# Patient Record
Sex: Female | Born: 1960 | Race: White | Hispanic: No | Marital: Single
Health system: Southern US, Community
[De-identification: ages and names within clinical notes are randomized; demographics above are authoritative.]

## PROBLEM LIST (undated history)

## (undated) HISTORY — PX: BREAST BIOPSY: SHX20

---

## 1997-09-14 ENCOUNTER — Other Ambulatory Visit: Admission: RE | Admit: 1997-09-14 | Discharge: 1997-09-14 | Payer: Self-pay | Admitting: Obstetrics and Gynecology

## 1998-11-10 ENCOUNTER — Other Ambulatory Visit: Admission: RE | Admit: 1998-11-10 | Discharge: 1998-11-10 | Payer: Self-pay | Admitting: Obstetrics and Gynecology

## 1999-11-14 ENCOUNTER — Other Ambulatory Visit: Admission: RE | Admit: 1999-11-14 | Discharge: 1999-11-14 | Payer: Self-pay | Admitting: Obstetrics and Gynecology

## 2000-11-05 ENCOUNTER — Ambulatory Visit (HOSPITAL_COMMUNITY): Admission: RE | Admit: 2000-11-05 | Discharge: 2000-11-05 | Payer: Self-pay | Admitting: Obstetrics and Gynecology

## 2000-11-05 ENCOUNTER — Encounter: Payer: Self-pay | Admitting: Obstetrics and Gynecology

## 2000-11-14 ENCOUNTER — Other Ambulatory Visit: Admission: RE | Admit: 2000-11-14 | Discharge: 2000-11-14 | Payer: Self-pay | Admitting: Obstetrics and Gynecology

## 2004-11-16 ENCOUNTER — Other Ambulatory Visit: Admission: RE | Admit: 2004-11-16 | Discharge: 2004-11-16 | Payer: Self-pay | Admitting: *Deleted

## 2005-04-26 ENCOUNTER — Ambulatory Visit (HOSPITAL_COMMUNITY): Admission: RE | Admit: 2005-04-26 | Discharge: 2005-04-26 | Payer: Self-pay | Admitting: *Deleted

## 2006-05-29 ENCOUNTER — Ambulatory Visit (HOSPITAL_COMMUNITY): Admission: RE | Admit: 2006-05-29 | Discharge: 2006-05-29 | Payer: Self-pay | Admitting: *Deleted

## 2007-05-30 ENCOUNTER — Ambulatory Visit (HOSPITAL_COMMUNITY): Admission: RE | Admit: 2007-05-30 | Discharge: 2007-05-30 | Payer: Self-pay | Admitting: *Deleted

## 2007-08-18 ENCOUNTER — Other Ambulatory Visit: Admission: RE | Admit: 2007-08-18 | Discharge: 2007-08-18 | Payer: Self-pay | Admitting: Gynecology

## 2008-10-27 ENCOUNTER — Ambulatory Visit (HOSPITAL_COMMUNITY): Admission: RE | Admit: 2008-10-27 | Discharge: 2008-10-27 | Payer: Self-pay | Admitting: Gynecology

## 2009-11-08 ENCOUNTER — Encounter: Admission: RE | Admit: 2009-11-08 | Discharge: 2009-11-08 | Payer: Self-pay | Admitting: Gynecology

## 2010-05-10 ENCOUNTER — Other Ambulatory Visit: Payer: Self-pay | Admitting: Gynecology

## 2010-05-10 DIAGNOSIS — Z09 Encounter for follow-up examination after completed treatment for conditions other than malignant neoplasm: Secondary | ICD-10-CM

## 2010-05-18 ENCOUNTER — Ambulatory Visit
Admission: RE | Admit: 2010-05-18 | Discharge: 2010-05-18 | Disposition: A | Discharge status: Home / Self Care | Payer: 59 | Source: Ambulatory Visit | Attending: Gynecology | Admitting: Gynecology

## 2010-05-18 DIAGNOSIS — Z09 Encounter for follow-up examination after completed treatment for conditions other than malignant neoplasm: Secondary | ICD-10-CM

## 2010-11-23 ENCOUNTER — Other Ambulatory Visit: Payer: Self-pay | Admitting: Gynecology

## 2010-11-23 DIAGNOSIS — N6489 Other specified disorders of breast: Secondary | ICD-10-CM

## 2010-11-30 ENCOUNTER — Ambulatory Visit
Admission: RE | Admit: 2010-11-30 | Discharge: 2010-11-30 | Disposition: A | Discharge status: Home / Self Care | Payer: 59 | Source: Ambulatory Visit | Attending: Gynecology | Admitting: Gynecology

## 2010-11-30 DIAGNOSIS — N6489 Other specified disorders of breast: Secondary | ICD-10-CM

## 2011-12-18 ENCOUNTER — Other Ambulatory Visit: Payer: Self-pay | Admitting: Gynecology

## 2011-12-18 DIAGNOSIS — R928 Other abnormal and inconclusive findings on diagnostic imaging of breast: Secondary | ICD-10-CM

## 2011-12-31 ENCOUNTER — Ambulatory Visit
Admission: RE | Admit: 2011-12-31 | Discharge: 2011-12-31 | Disposition: A | Discharge status: Home / Self Care | Payer: 59 | Source: Ambulatory Visit | Attending: Gynecology | Admitting: Gynecology

## 2011-12-31 ENCOUNTER — Other Ambulatory Visit: Payer: Self-pay | Admitting: Gynecology

## 2011-12-31 DIAGNOSIS — R928 Other abnormal and inconclusive findings on diagnostic imaging of breast: Secondary | ICD-10-CM

## 2012-10-22 ENCOUNTER — Other Ambulatory Visit: Payer: Self-pay | Admitting: Gynecology

## 2012-10-22 DIAGNOSIS — R921 Mammographic calcification found on diagnostic imaging of breast: Secondary | ICD-10-CM

## 2012-11-03 ENCOUNTER — Ambulatory Visit
Admission: RE | Admit: 2012-11-03 | Discharge: 2012-11-03 | Disposition: A | Discharge status: Home / Self Care | Payer: 59 | Source: Ambulatory Visit | Attending: Gynecology | Admitting: Gynecology

## 2012-11-03 ENCOUNTER — Other Ambulatory Visit: Payer: Self-pay | Admitting: Gynecology

## 2012-11-03 DIAGNOSIS — R921 Mammographic calcification found on diagnostic imaging of breast: Secondary | ICD-10-CM

## 2012-11-07 ENCOUNTER — Ambulatory Visit
Admission: RE | Admit: 2012-11-07 | Discharge: 2012-11-07 | Disposition: A | Discharge status: Home / Self Care | Payer: 59 | Source: Ambulatory Visit | Attending: Gynecology | Admitting: Gynecology

## 2012-11-07 ENCOUNTER — Other Ambulatory Visit: Payer: Self-pay | Admitting: Diagnostic Radiology

## 2012-11-07 DIAGNOSIS — R921 Mammographic calcification found on diagnostic imaging of breast: Secondary | ICD-10-CM

## 2013-07-01 ENCOUNTER — Other Ambulatory Visit: Payer: Self-pay | Admitting: Gynecology

## 2013-07-01 DIAGNOSIS — R921 Mammographic calcification found on diagnostic imaging of breast: Secondary | ICD-10-CM

## 2013-07-14 ENCOUNTER — Ambulatory Visit
Admission: RE | Admit: 2013-07-14 | Discharge: 2013-07-14 | Disposition: A | Discharge status: Home / Self Care | Payer: 59 | Source: Ambulatory Visit | Attending: Gynecology | Admitting: Gynecology

## 2013-07-14 DIAGNOSIS — R921 Mammographic calcification found on diagnostic imaging of breast: Secondary | ICD-10-CM

## 2013-11-12 ENCOUNTER — Other Ambulatory Visit: Payer: Self-pay | Admitting: Gynecology

## 2013-11-12 DIAGNOSIS — R921 Mammographic calcification found on diagnostic imaging of breast: Secondary | ICD-10-CM

## 2013-11-24 ENCOUNTER — Encounter (INDEPENDENT_AMBULATORY_CARE_PROVIDER_SITE_OTHER): Payer: Self-pay

## 2013-11-24 ENCOUNTER — Ambulatory Visit
Admission: RE | Admit: 2013-11-24 | Discharge: 2013-11-24 | Disposition: A | Discharge status: Home / Self Care | Payer: 59 | Source: Ambulatory Visit | Attending: Gynecology | Admitting: Gynecology

## 2013-11-24 DIAGNOSIS — R921 Mammographic calcification found on diagnostic imaging of breast: Secondary | ICD-10-CM

## 2014-05-28 ENCOUNTER — Other Ambulatory Visit: Payer: Self-pay | Admitting: Family Medicine

## 2014-05-28 ENCOUNTER — Ambulatory Visit
Admission: RE | Admit: 2014-05-28 | Discharge: 2014-05-28 | Disposition: A | Discharge status: Home / Self Care | Payer: 59 | Source: Ambulatory Visit | Attending: Family Medicine | Admitting: Family Medicine

## 2014-05-28 DIAGNOSIS — M25541 Pain in joints of right hand: Secondary | ICD-10-CM

## 2014-08-25 IMAGING — MG MM BREAST STEREO BIOPSY LEFT
3 series · 3 of 3 positions shown · non-contrast
Comparison: Previous exams.

ADDENDUM:
Pathology demonstrates benign breast tissue with very focal
fibrocystic changes and associated calcifications. This is
concordant. The patient was notified of results on 11/10/2012. Other
than some soreness, the patient has no complaints regarding her
biopsy site. A 6 month followup left diagnostic mammogram is
recommended.
CLINICAL DATA: 52-year-old female with suspicious calcifications in
the left breast at 12 o'clock.

EXAM:
STEREOTACTIC CORE NEEDLE BIOPSY

[L CC]
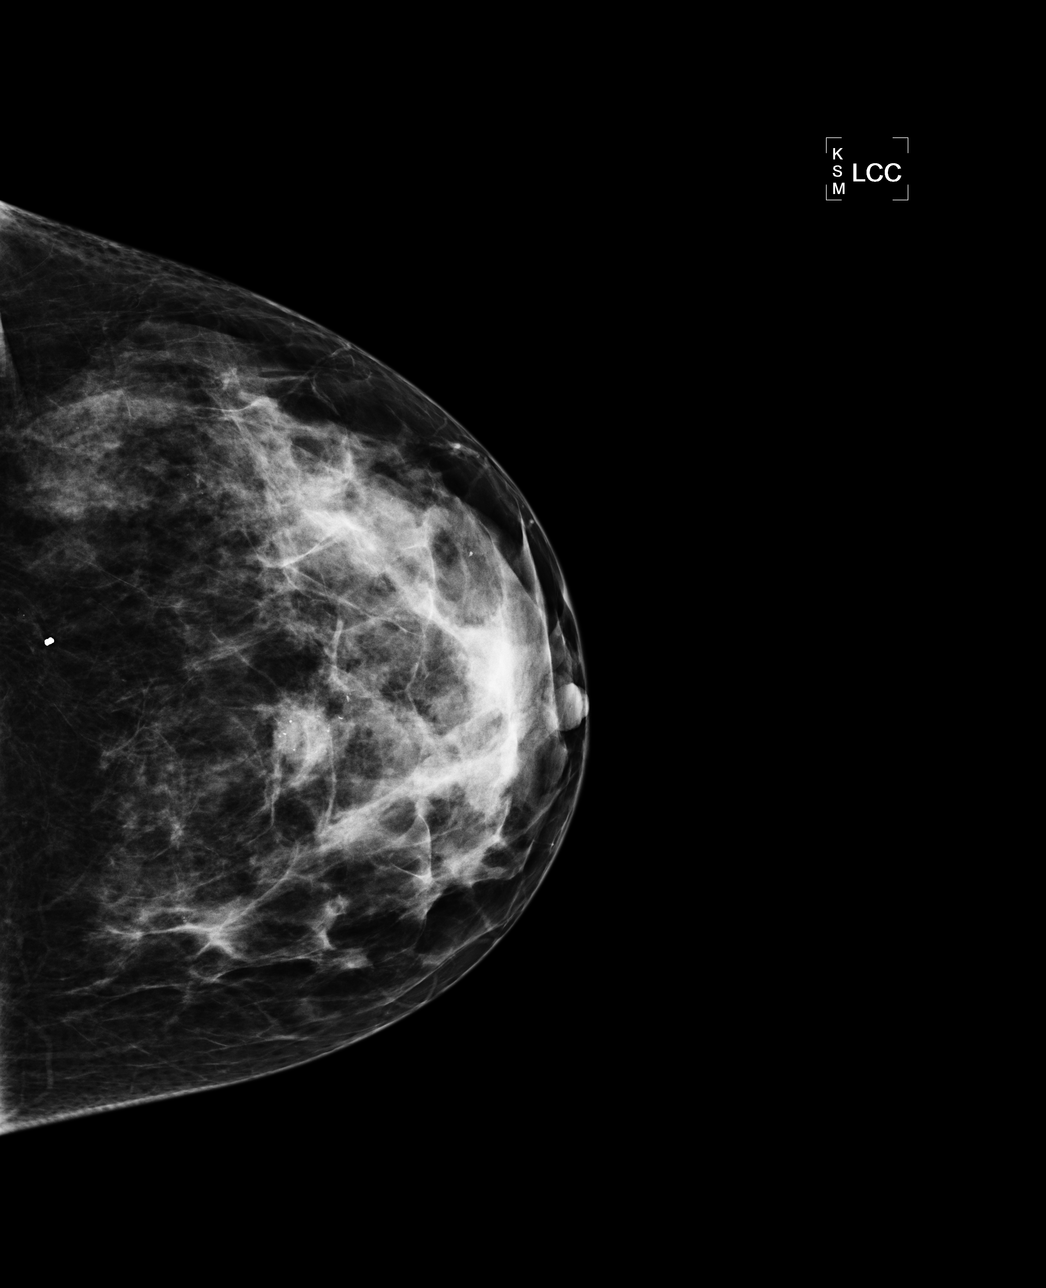

[L ML]
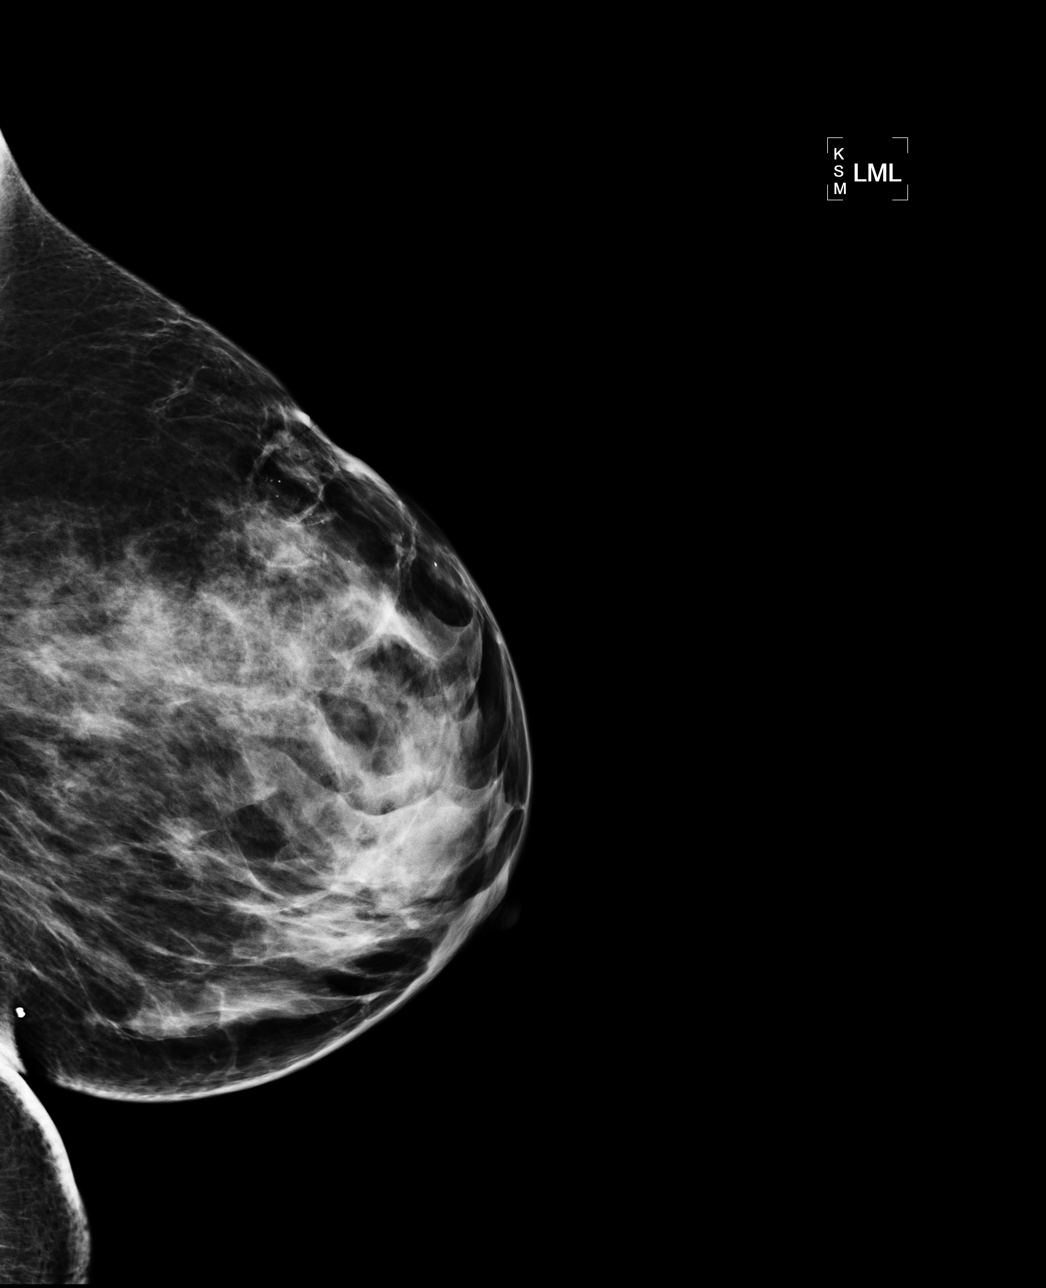

[L SPECIMEN]
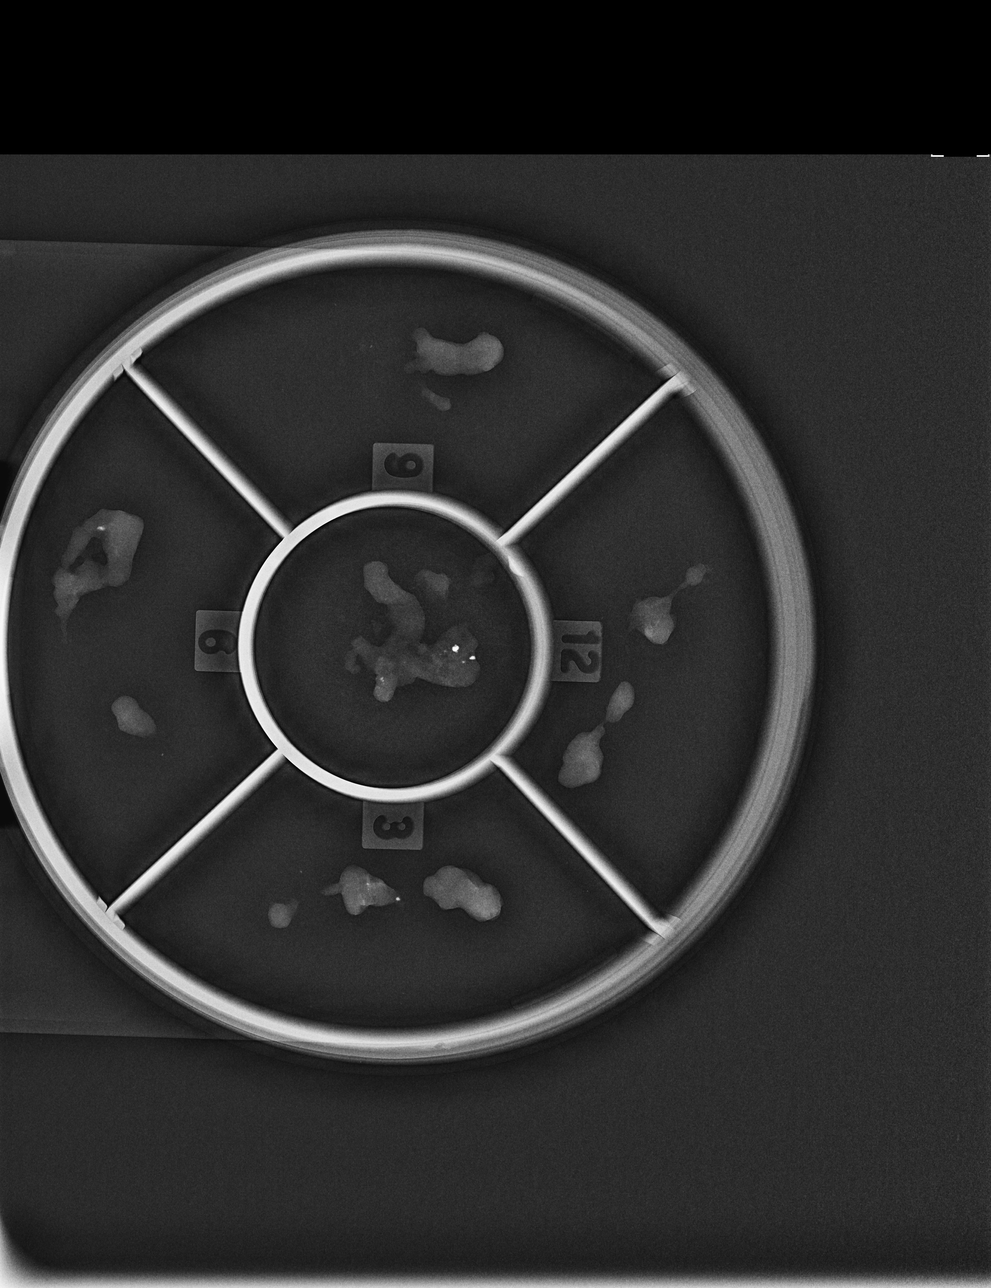

[3 of 3 positions shown; findings below may reference images not displayed]

FINDINGS: I met with the patient and we discussed the procedure of
stereotactic-guided biopsy, including benefits and alternatives. We
discussed the high likelihood of a successful procedure. We
discussed the risks of the procedure, including infection, bleeding,
tissue injury, clip migration, and inadequate sampling. Informed,
written consent was given.

Using sterile technique and 2% Lidocaine as local anesthetic, under
stereotactic guidance, a 9 gauge vacuum device was used to perform
core needle biopsy of the suspicious calcifications in the left
breast at 12 o'clock anterior to mid depth using a superior
approach. Specimen radiograph was performed, showing calcifications
in at least 3 of the specimens. Specimens with calcifications are
identified for pathology.

At the conclusion of the procedure, a dumb Sympa tissue marker clip
was deployed into the biopsy cavity immediately post biopsy, however
on the followup mammogram the clip was no longer present. The
patient could have reported excessive bleeding after the procedure,
in the clip likely blood Sympa along the biopsy tract. The biopsied
calcifications in the left breast at 12 o'clock mid anterior depth
are circled on the post procedure mammogram for future reference.
IMPRESSION: 1. Stereotactic-guided biopsy of suspicious calcifications in the
left breast at 12 o'clock anterior to mid depth.

2. The biopsy marking clip likely bled out along the biopsy tract as
the clip was not present on the post biopsy mammogram. The biopsy
calcifications are circled for future reference.

## 2014-11-22 ENCOUNTER — Other Ambulatory Visit: Payer: Self-pay

## 2014-11-22 DIAGNOSIS — Z1231 Encounter for screening mammogram for malignant neoplasm of breast: Secondary | ICD-10-CM

## 2014-12-22 ENCOUNTER — Ambulatory Visit
Admission: RE | Admit: 2014-12-22 | Discharge: 2014-12-22 | Disposition: A | Discharge status: Home / Self Care | Payer: 59 | Source: Ambulatory Visit

## 2014-12-22 DIAGNOSIS — Z1231 Encounter for screening mammogram for malignant neoplasm of breast: Secondary | ICD-10-CM

## 2015-11-07 ENCOUNTER — Other Ambulatory Visit: Payer: Self-pay | Admitting: Obstetrics and Gynecology

## 2015-11-07 DIAGNOSIS — Z1231 Encounter for screening mammogram for malignant neoplasm of breast: Secondary | ICD-10-CM

## 2015-12-23 ENCOUNTER — Ambulatory Visit
Admission: RE | Admit: 2015-12-23 | Discharge: 2015-12-23 | Disposition: A | Discharge status: Home / Self Care | Payer: 59 | Source: Ambulatory Visit | Attending: Obstetrics and Gynecology | Admitting: Obstetrics and Gynecology

## 2015-12-23 DIAGNOSIS — Z1231 Encounter for screening mammogram for malignant neoplasm of breast: Secondary | ICD-10-CM

## 2016-02-20 DIAGNOSIS — Z01419 Encounter for gynecological examination (general) (routine) without abnormal findings: Secondary | ICD-10-CM | POA: Diagnosis not present

## 2016-06-01 ENCOUNTER — Other Ambulatory Visit: Payer: Self-pay | Admitting: Family Medicine

## 2016-06-01 ENCOUNTER — Ambulatory Visit
Admission: RE | Admit: 2016-06-01 | Discharge: 2016-06-01 | Disposition: A | Discharge status: Home / Self Care | Payer: 59 | Source: Ambulatory Visit | Attending: Family Medicine | Admitting: Family Medicine

## 2016-06-01 DIAGNOSIS — G4762 Sleep related leg cramps: Secondary | ICD-10-CM | POA: Diagnosis not present

## 2016-06-01 DIAGNOSIS — M25561 Pain in right knee: Secondary | ICD-10-CM | POA: Diagnosis not present

## 2016-06-01 DIAGNOSIS — B079 Viral wart, unspecified: Secondary | ICD-10-CM | POA: Diagnosis not present

## 2016-06-19 DIAGNOSIS — B079 Viral wart, unspecified: Secondary | ICD-10-CM | POA: Diagnosis not present

## 2016-11-21 ENCOUNTER — Other Ambulatory Visit: Payer: Self-pay | Admitting: Obstetrics and Gynecology

## 2016-11-21 DIAGNOSIS — Z1231 Encounter for screening mammogram for malignant neoplasm of breast: Secondary | ICD-10-CM

## 2016-12-24 ENCOUNTER — Ambulatory Visit
Admission: RE | Admit: 2016-12-24 | Discharge: 2016-12-24 | Disposition: A | Discharge status: Home / Self Care | Payer: 59 | Source: Ambulatory Visit | Attending: Obstetrics and Gynecology | Admitting: Obstetrics and Gynecology

## 2016-12-24 ENCOUNTER — Encounter: Payer: Self-pay | Admitting: Radiology

## 2016-12-24 DIAGNOSIS — Z1231 Encounter for screening mammogram for malignant neoplasm of breast: Secondary | ICD-10-CM

## 2016-12-25 ENCOUNTER — Other Ambulatory Visit: Payer: Self-pay | Admitting: Obstetrics and Gynecology

## 2016-12-25 DIAGNOSIS — R928 Other abnormal and inconclusive findings on diagnostic imaging of breast: Secondary | ICD-10-CM

## 2016-12-27 ENCOUNTER — Ambulatory Visit
Admission: RE | Admit: 2016-12-27 | Discharge: 2016-12-27 | Disposition: A | Discharge status: Home / Self Care | Payer: 59 | Source: Ambulatory Visit | Attending: Obstetrics and Gynecology | Admitting: Obstetrics and Gynecology

## 2016-12-27 DIAGNOSIS — R921 Mammographic calcification found on diagnostic imaging of breast: Secondary | ICD-10-CM | POA: Diagnosis not present

## 2016-12-27 DIAGNOSIS — R928 Other abnormal and inconclusive findings on diagnostic imaging of breast: Secondary | ICD-10-CM

## 2017-02-28 DIAGNOSIS — Z6830 Body mass index (BMI) 30.0-30.9, adult: Secondary | ICD-10-CM | POA: Diagnosis not present

## 2017-02-28 DIAGNOSIS — Z01419 Encounter for gynecological examination (general) (routine) without abnormal findings: Secondary | ICD-10-CM | POA: Diagnosis not present

## 2017-11-12 ENCOUNTER — Other Ambulatory Visit: Payer: Self-pay | Admitting: Obstetrics and Gynecology

## 2017-11-12 DIAGNOSIS — Z1231 Encounter for screening mammogram for malignant neoplasm of breast: Secondary | ICD-10-CM

## 2017-12-25 ENCOUNTER — Ambulatory Visit
Admission: RE | Admit: 2017-12-25 | Discharge: 2017-12-25 | Disposition: A | Discharge status: Home / Self Care | Payer: 59 | Source: Ambulatory Visit | Attending: Obstetrics and Gynecology | Admitting: Obstetrics and Gynecology

## 2017-12-25 DIAGNOSIS — Z1231 Encounter for screening mammogram for malignant neoplasm of breast: Secondary | ICD-10-CM

## 2018-06-05 DIAGNOSIS — M19042 Primary osteoarthritis, left hand: Secondary | ICD-10-CM | POA: Diagnosis not present

## 2018-06-05 DIAGNOSIS — M19041 Primary osteoarthritis, right hand: Secondary | ICD-10-CM | POA: Diagnosis not present

## 2018-06-05 DIAGNOSIS — L03012 Cellulitis of left finger: Secondary | ICD-10-CM | POA: Diagnosis not present

## 2018-06-08 DIAGNOSIS — L02512 Cutaneous abscess of left hand: Secondary | ICD-10-CM | POA: Diagnosis not present

## 2019-01-27 ENCOUNTER — Other Ambulatory Visit: Payer: Self-pay | Admitting: Obstetrics and Gynecology

## 2019-01-27 DIAGNOSIS — Z1231 Encounter for screening mammogram for malignant neoplasm of breast: Secondary | ICD-10-CM

## 2019-01-28 ENCOUNTER — Ambulatory Visit
Admission: RE | Admit: 2019-01-28 | Discharge: 2019-01-28 | Disposition: A | Discharge status: Home / Self Care | Payer: BC Managed Care – PPO | Source: Ambulatory Visit | Attending: Obstetrics and Gynecology | Admitting: Obstetrics and Gynecology

## 2019-01-28 ENCOUNTER — Other Ambulatory Visit: Payer: Self-pay

## 2019-01-28 DIAGNOSIS — Z1231 Encounter for screening mammogram for malignant neoplasm of breast: Secondary | ICD-10-CM

## 2019-09-02 DIAGNOSIS — Z6829 Body mass index (BMI) 29.0-29.9, adult: Secondary | ICD-10-CM | POA: Diagnosis not present

## 2019-09-02 DIAGNOSIS — M8580 Other specified disorders of bone density and structure, unspecified site: Secondary | ICD-10-CM | POA: Diagnosis not present

## 2019-09-02 DIAGNOSIS — Z01419 Encounter for gynecological examination (general) (routine) without abnormal findings: Secondary | ICD-10-CM | POA: Diagnosis not present

## 2019-11-17 DIAGNOSIS — H04123 Dry eye syndrome of bilateral lacrimal glands: Secondary | ICD-10-CM | POA: Diagnosis not present

## 2019-11-17 DIAGNOSIS — H2513 Age-related nuclear cataract, bilateral: Secondary | ICD-10-CM | POA: Diagnosis not present

## 2019-11-17 DIAGNOSIS — H35373 Puckering of macula, bilateral: Secondary | ICD-10-CM | POA: Diagnosis not present

## 2020-01-13 ENCOUNTER — Other Ambulatory Visit: Payer: Self-pay | Admitting: Obstetrics and Gynecology

## 2020-01-13 DIAGNOSIS — Z1231 Encounter for screening mammogram for malignant neoplasm of breast: Secondary | ICD-10-CM

## 2020-02-23 ENCOUNTER — Ambulatory Visit: Payer: BC Managed Care – PPO

## 2020-04-06 ENCOUNTER — Inpatient Hospital Stay: Admission: RE | Admit: 2020-04-06 | Payer: BC Managed Care – PPO | Source: Ambulatory Visit

## 2020-04-12 DIAGNOSIS — M1711 Unilateral primary osteoarthritis, right knee: Secondary | ICD-10-CM | POA: Diagnosis not present

## 2020-04-27 ENCOUNTER — Encounter: Payer: Self-pay | Admitting: Family Medicine

## 2020-04-27 ENCOUNTER — Other Ambulatory Visit: Payer: Self-pay

## 2020-04-27 ENCOUNTER — Ambulatory Visit: Payer: BC Managed Care – PPO | Admitting: Family Medicine

## 2020-04-27 DIAGNOSIS — M25561 Pain in right knee: Secondary | ICD-10-CM

## 2020-04-27 NOTE — Progress Notes (Signed)
Office Visit Note   Patient: Melanie Cooper           Date of Birth: Jun 26, 1960           MRN: [id number] Visit Date: 04/27/2020 Requested by: Wilfrid Lund, PA [address] [place],  [place] [address] PCP: Wilfrid Lund, [place]  Subjective: Chief Complaint  Patient presents with  . Right Knee - Pain    Pain flared up in the knee 4 weeks ago. The knee was swollen. She went to her PCP and was given meloxicam 15 mg to take daily - this does help. Has had pains in the knee intermittently x several years.     HPI: 60yo F presenting to clinic with 4 weeks of right knee pain. Patient states that she was told four years ago she had arthritis in her knees, but this rarely troubles her. She tries to stay active, and until recently was in daily Zumba classes. She's not sure what she did, but says about four weeks ago her right knee started to hurt significantly and swell. She bought a compression sleeve, which helped somewhat, and spoke to her PCM. Her PCM gave her Meloxicam 15mg , and she says that since starting this medication her pain has nearly resolved. She feels much better today, saying the swelling has gone down and she's able to walk comfortably. No catching/locking of the knees. She says she wants to stay active, but isn't sure what's safe to do. She has three flights of stairs at work, and takes this daily, but isn't sure if stairs are a good idea for her knee health. She has no history of gastric or renal pathology, and says she has been tolerating the Mobic very well thus far.               ROS:   All other systems were reviewed and are negative.  Objective: Vital Signs: There were no vitals taken for this visit.  Physical Exam:  General:  Alert and oriented, in no acute distress. Pulm:  Breathing unlabored. Psy:  Normal mood, congruent affect. Skin:  Right knee with no bruising, no rashes, no erythema. Overlying skin intact.   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   Seated Exam:  Significant patellar crepitus bilaterally, Negative J-Sign.   Palpation: Endorses mild tenderness to palpation over the medial joint line, as well as with patellar compression/patellar facets. No pain over lateral joint line or at patellar tendon.    Supine exam: Trace effusion, normal patellar mobility.   Ligamentous Exam:  No pain or laxity with anterior/posterior drawer.  No obvious Sag.  No pain though does have very mild psuedolaxity with valgus stress across the knee.   Meniscus:  McMurray with no pain or deep clicking within the joint line, though does have significant patellar clicking.   Strength: Hip flexion (L1), Hip Aduction (L2), Knee Extension (L3) are 5/5 Bilaterally Foot Inversion (L4), Dorsiflexion (L5), and Eversion (S1) 5/5 Bilaterally  Sensation: Intact to light touch medial and lateral aspects of lower extremities, and lateral, dorsal, and medial aspects of foot.    Imaging: No results found.  Assessment & Plan: 60yo F presenting to clinic with 4 weeks of right knee pain, which has significantly improved with NSAID therapy. Given her improvement, doubt need for CSI today. - Discussed Glucosamine, Tumeric, which she is already taking - Discussed quad exercises to  stabilize the knees, and provided with HEP. Encouraged to try stationary bike or elliptical at the local gym for her ongoing fitness.  - Avoid deep squats/lunges.  - May continue Meloxicam with flares, though should step down from daily use. Could consider Voltaren gel for safer daily alternative, with Meloxicam available for breakthrough. - If pain worsens, consider CSI at that time.  - Patient expresses understanding with plan. She has no further questions or concerns today.     Procedures: No procedures performed        PMFS History: There are no problems to display for this  patient.  History reviewed. No pertinent past medical history.  History reviewed. No pertinent family history.  Past Surgical History:  Procedure Laterality Date  . BREAST BIOPSY Left    Social History   Occupational History  . Not on file  Tobacco Use  . Smoking status: Not on file  . Smokeless tobacco: Not on file  Substance and Sexual Activity  . Alcohol use: Not on file  . Drug use: Not on file  . Sexual activity: Not on file

## 2020-04-27 NOTE — Progress Notes (Signed)
I saw and examined the patient with Dr. Marga Hoots and agree with assessment and plan as outlined.    Right knee pain, improved since taking meloxicam.  Will add home exercises, try voltaren gel in place of meloxicam when able.

## 2020-05-30 ENCOUNTER — Ambulatory Visit
Admission: RE | Admit: 2020-05-30 | Discharge: 2020-05-30 | Disposition: A | Discharge status: Home / Self Care | Payer: BC Managed Care – PPO | Source: Ambulatory Visit | Attending: Obstetrics and Gynecology | Admitting: Obstetrics and Gynecology

## 2020-05-30 ENCOUNTER — Other Ambulatory Visit: Payer: Self-pay

## 2020-05-30 DIAGNOSIS — Z1231 Encounter for screening mammogram for malignant neoplasm of breast: Secondary | ICD-10-CM | POA: Diagnosis not present

## 2021-05-11 DIAGNOSIS — H04123 Dry eye syndrome of bilateral lacrimal glands: Secondary | ICD-10-CM | POA: Diagnosis not present

## 2021-05-11 DIAGNOSIS — H524 Presbyopia: Secondary | ICD-10-CM | POA: Diagnosis not present

## 2021-05-11 DIAGNOSIS — H2513 Age-related nuclear cataract, bilateral: Secondary | ICD-10-CM | POA: Diagnosis not present

## 2021-05-11 DIAGNOSIS — H35373 Puckering of macula, bilateral: Secondary | ICD-10-CM | POA: Diagnosis not present

## 2021-05-23 DIAGNOSIS — Z124 Encounter for screening for malignant neoplasm of cervix: Secondary | ICD-10-CM | POA: Diagnosis not present

## 2021-05-23 DIAGNOSIS — Z01419 Encounter for gynecological examination (general) (routine) without abnormal findings: Secondary | ICD-10-CM | POA: Diagnosis not present

## 2021-05-23 DIAGNOSIS — Z6830 Body mass index (BMI) 30.0-30.9, adult: Secondary | ICD-10-CM | POA: Diagnosis not present

## 2021-06-01 ENCOUNTER — Other Ambulatory Visit: Payer: Self-pay | Admitting: Obstetrics and Gynecology

## 2021-06-01 DIAGNOSIS — Z1231 Encounter for screening mammogram for malignant neoplasm of breast: Secondary | ICD-10-CM

## 2021-06-05 ENCOUNTER — Ambulatory Visit
Admission: RE | Admit: 2021-06-05 | Discharge: 2021-06-05 | Disposition: A | Discharge status: Home / Self Care | Payer: BC Managed Care – PPO | Source: Ambulatory Visit | Attending: Obstetrics and Gynecology | Admitting: Obstetrics and Gynecology

## 2021-06-05 DIAGNOSIS — Z1231 Encounter for screening mammogram for malignant neoplasm of breast: Secondary | ICD-10-CM

## 2022-11-27 ENCOUNTER — Other Ambulatory Visit: Payer: Self-pay | Admitting: Obstetrics and Gynecology

## 2022-11-27 DIAGNOSIS — Z1231 Encounter for screening mammogram for malignant neoplasm of breast: Secondary | ICD-10-CM

## 2022-11-28 ENCOUNTER — Ambulatory Visit
Admission: RE | Admit: 2022-11-28 | Discharge: 2022-11-28 | Disposition: A | Discharge status: Home / Self Care | Payer: BC Managed Care – PPO | Source: Ambulatory Visit | Attending: Obstetrics and Gynecology | Admitting: Obstetrics and Gynecology

## 2022-11-28 DIAGNOSIS — Z1231 Encounter for screening mammogram for malignant neoplasm of breast: Secondary | ICD-10-CM | POA: Diagnosis not present

## 2022-12-11 DIAGNOSIS — Z6828 Body mass index (BMI) 28.0-28.9, adult: Secondary | ICD-10-CM | POA: Diagnosis not present

## 2022-12-11 DIAGNOSIS — Z01419 Encounter for gynecological examination (general) (routine) without abnormal findings: Secondary | ICD-10-CM | POA: Diagnosis not present

## 2023-06-27 DIAGNOSIS — G5600 Carpal tunnel syndrome, unspecified upper limb: Secondary | ICD-10-CM | POA: Diagnosis not present

## 2023-09-04 DIAGNOSIS — S39012A Strain of muscle, fascia and tendon of lower back, initial encounter: Secondary | ICD-10-CM | POA: Diagnosis not present

## 2023-11-07 ENCOUNTER — Other Ambulatory Visit: Payer: Self-pay | Admitting: Obstetrics and Gynecology

## 2023-11-07 DIAGNOSIS — Z1231 Encounter for screening mammogram for malignant neoplasm of breast: Secondary | ICD-10-CM

## 2023-11-07 DIAGNOSIS — M25561 Pain in right knee: Secondary | ICD-10-CM | POA: Diagnosis not present

## 2023-11-29 ENCOUNTER — Ambulatory Visit
Admission: RE | Admit: 2023-11-29 | Discharge: 2023-11-29 | Disposition: A | Discharge status: Home / Self Care | Source: Ambulatory Visit | Attending: Obstetrics and Gynecology | Admitting: Obstetrics and Gynecology

## 2023-11-29 DIAGNOSIS — Z1231 Encounter for screening mammogram for malignant neoplasm of breast: Secondary | ICD-10-CM | POA: Diagnosis not present

## 2024-02-18 NOTE — Progress Notes (Unsigned)
" ° °  Melanie Cooper - 64 y.o. female MRN [id number]  Date of birth: Jun 14, 1960  Office Visit Note: Visit Date: 02/19/2024 PCP: Alben Therisa MATSU, PA Referred by: Alben Therisa MATSU, PA  Subjective: No chief complaint on file.  HPI: Melanie Cooper is a pleasant 64 y.o. female who presents today for ***  Pertinent ROS were reviewed with the patient and found to be negative unless otherwise specified above in HPI.   Visit Reason: Duration of symptoms: Hand dominance: {RIGHT/LEFT:20294} Occupation: Diabetic: {yes/no:20286} Smoking: {yes/no:20286} Heart/Lung History: Blood Thinners:   Prior Testing/EMG: Injections (Date): Treatments: Prior Surgery:    Assessment & Plan: Visit Diagnoses: No diagnosis found.  Plan: ***  Follow-up: No follow-ups on file.   Meds & Orders: No orders of the defined types were placed in this encounter.  No orders of the defined types were placed in this encounter.    Procedures: No procedures performed      Clinical History: No specialty comments available.  She has no history on file for tobacco use. No results for input(s): HGBA1C, LABURIC in the last 8760 hours.  Objective:   Vital Signs: There were no vitals taken for this visit.  Physical Exam  Gen: Well-appearing, in no acute distress; non-toxic CV: Regular Rate. Well-perfused. Warm.  Resp: Breathing unlabored on room air; no wheezing. Psych: Fluid speech in conversation; appropriate affect; normal thought process  Ortho Exam - ***   Imaging: No results found.  Past Medical/Family/Surgical/Social History: Medications & Allergies reviewed per EMR, new medications updated. There are no active problems to display for this patient.  No past medical history on file. Family History  Problem Relation Age of Onset   Breast cancer Paternal Aunt    Past Surgical History:  Procedure Laterality Date   BREAST BIOPSY Left    Social History   Occupational History   Not on file   Tobacco Use   Smoking status: Not on file   Smokeless tobacco: Not on file  Substance and Sexual Activity   Alcohol use: Not on file   Drug use: Not on file   Sexual activity: Not on file    Nic Lampe Estela) Ar[place], M.D. [facility], Hand Surgery  "

## 2024-02-19 ENCOUNTER — Ambulatory Visit: Admitting: Orthopedic Surgery

## 2024-02-19 ENCOUNTER — Other Ambulatory Visit (INDEPENDENT_AMBULATORY_CARE_PROVIDER_SITE_OTHER): Payer: Self-pay

## 2024-02-19 DIAGNOSIS — M25522 Pain in left elbow: Secondary | ICD-10-CM

## 2024-02-19 DIAGNOSIS — G5622 Lesion of ulnar nerve, left upper limb: Secondary | ICD-10-CM

## 2024-03-17 ENCOUNTER — Encounter: Admitting: Physical Medicine and Rehabilitation
